# Patient Record
Sex: Female | Born: 1950 | Race: White | Hispanic: No | Marital: Married | Smoking: Former smoker
Health system: Southern US, Community
[De-identification: ages and names within clinical notes are randomized; demographics above are authoritative.]

## PROBLEM LIST (undated history)

## (undated) DIAGNOSIS — T883XXA Malignant hyperthermia due to anesthesia, initial encounter: Secondary | ICD-10-CM

## (undated) HISTORY — DX: Malignant hyperthermia due to anesthesia, initial encounter: T88.3XXA

## (undated) HISTORY — PX: OTHER SURGICAL HISTORY: SHX169

---

## 2021-02-01 ENCOUNTER — Emergency Department
Admission: EM | Admit: 2021-02-01 | Discharge: 2021-02-01 | Disposition: A | Discharge status: Home / Self Care | Payer: Medicare Other | Attending: Emergency Medicine | Admitting: Emergency Medicine

## 2021-02-01 ENCOUNTER — Encounter: Payer: Self-pay | Admitting: Emergency Medicine

## 2021-02-01 ENCOUNTER — Emergency Department: Payer: Medicare Other

## 2021-02-01 ENCOUNTER — Other Ambulatory Visit: Payer: Self-pay

## 2021-02-01 DIAGNOSIS — W01198A Fall on same level from slipping, tripping and stumbling with subsequent striking against other object, initial encounter: Secondary | ICD-10-CM | POA: Insufficient documentation

## 2021-02-01 DIAGNOSIS — S52531A Colles' fracture of right radius, initial encounter for closed fracture: Secondary | ICD-10-CM | POA: Diagnosis not present

## 2021-02-01 DIAGNOSIS — Z87891 Personal history of nicotine dependence: Secondary | ICD-10-CM | POA: Insufficient documentation

## 2021-02-01 DIAGNOSIS — Y92096 Garden or yard of other non-institutional residence as the place of occurrence of the external cause: Secondary | ICD-10-CM | POA: Diagnosis not present

## 2021-02-01 DIAGNOSIS — S52691A Other fracture of lower end of right ulna, initial encounter for closed fracture: Secondary | ICD-10-CM

## 2021-02-01 DIAGNOSIS — Y93H2 Activity, gardening and landscaping: Secondary | ICD-10-CM | POA: Diagnosis not present

## 2021-02-01 DIAGNOSIS — S52601A Unspecified fracture of lower end of right ulna, initial encounter for closed fracture: Secondary | ICD-10-CM | POA: Insufficient documentation

## 2021-02-01 DIAGNOSIS — S6991XA Unspecified injury of right wrist, hand and finger(s), initial encounter: Secondary | ICD-10-CM | POA: Diagnosis present

## 2021-02-01 MED ORDER — OXYCODONE HCL 5 MG PO TABS: 5.0000 mg | ORAL_TABLET | Freq: Three times a day (TID) | ORAL | 0 refills | Status: AC | PRN

## 2021-02-01 MED ORDER — MELOXICAM 7.5 MG PO TABS: 7.5000 mg | ORAL_TABLET | Freq: Every day | ORAL | 0 refills | Status: AC

## 2021-02-01 MED ORDER — OXYCODONE HCL 5 MG PO TABS
5.0000 mg | ORAL_TABLET | Freq: Once | ORAL | Status: AC
Start: 2021-02-01 — End: 2021-02-01
  Administered 2021-02-01: 5 mg via ORAL
  Filled 2021-02-01: qty 1

## 2021-02-01 NOTE — ED Provider Notes (Signed)
[facility] Emergency Department Provider Note ____________________________________________  Time seen: Approximately 5:26 PM  I have reviewed the triage vital signs and the nursing notes.   HISTORY  Chief Complaint Arm Pain    HPI Raven Jones is a 70 y.o. female who presents to the emergency department for evaluation and treatment of right wrist and arm pain after falling onto her outstretched arm.  She was out cleaning the garden shed when she fell forward and tried to protect her face from hitting the wall and injured her right wrist.  Pain radiates from the hand/wrist up to the shoulder.  No other injuries.  No alleviating measures attempted prior to arrival.   Past Medical History:  Diagnosis Date  . Malignant hyperthermia     There are no problems to display for this patient.   Past Surgical History:  Procedure Laterality Date  . Partial Hysterectomy      Prior to Admission medications   Medication Sig Start Date End Date Taking? Authorizing Provider  meloxicam (MOBIC) 7.5 MG tablet Take 1 tablet (7.5 mg total) by mouth daily. 02/01/21 02/01/22 Yes Juanette Urizar B, FNP  oxyCODONE (ROXICODONE) 5 MG immediate release tablet Take 1 tablet (5 mg total) by mouth every 8 (eight) hours as needed. 02/01/21 02/01/22 Yes Cythia Bachtel B, FNP    Allergies Amoxicillin and Other  No family history on file.  Social History Social History   Tobacco Use  . Smoking status: Former Games developer  . Smokeless tobacco: Never Used  Substance Use Topics  . Alcohol use: Not Currently  . Drug use: Not Currently    Review of Systems Constitutional: Negative for fever. Cardiovascular: Negative for chest pain. Respiratory: Negative for shortness of breath. Musculoskeletal: Positive for right wrist and right arm pain. Skin: Negative for open wounds or lesions. Neurological: Negative for decrease in  sensation  ____________________________________________   PHYSICAL EXAM:  VITAL SIGNS: ED Triage Vitals  Enc Vitals Group     BP 02/01/21 1456 (!) 96/44     Pulse Rate 02/01/21 1456 (!) 56     Resp 02/01/21 1456 20     Temp 02/01/21 1456 98.2 F (36.8 C)     Temp Source 02/01/21 1456 Oral     SpO2 02/01/21 1456 97 %     Weight 02/01/21 1453 175 lb (79.4 kg)     Height 02/01/21 1453 5\' 5"  (1.651 m)     Head Circumference --      Peak Flow --      Pain Score 02/01/21 1453 8     Pain Loc --      Pain Edu? --      Excl. in GC? --     Constitutional: Alert and oriented. Well appearing and in no acute distress. Eyes: Conjunctivae are clear without discharge or drainage Head: Atraumatic Neck: Supple. Respiratory: No cough. Respirations are even and unlabored. Musculoskeletal: Deformity of the right wrist.  Diffuse tenderness along the forearm and elbow.  Range of motion of the right upper extremity is limited by pain. Neurologic: Motor and sensory function is intact. Skin: No open wounds or lesions.  She does have some early ecchymosis noted to the palm and thenar eminence Psychiatric: Affect and behavior are appropriate.  ____________________________________________   LABS (all labs ordered are listed, but only abnormal results are displayed)  Labs Reviewed - No data to display ____________________________________________  RADIOLOGY  Comminuted fracture of the distal right radius with extension into the radiocarpal joint  space.  I, Kem Boroughs, personally viewed and evaluated these images (plain radiographs) as part of my medical decision making, as well as reviewing the written report by the radiologist.  DG Forearm Right  Result Date: 02/01/2021 CLINICAL DATA:  Status post fall. EXAM: RIGHT FOREARM - 2 VIEW COMPARISON:  None. FINDINGS: An acute comminuted fracture deformity is seen involving the distal right radius. Extension to involve the radiocarpal articulation  is seen. An additional nondisplaced fracture of the distal right ulna is noted. There is no evidence of dislocation. Moderate severity diffuse soft tissue swelling is present. IMPRESSION: Acute fractures of the distal right radius and distal right ulna. Electronically Signed   By: Aram Candela M.D.   On: 02/01/2021 15:58   DG Humerus Right  Result Date: 02/01/2021 CLINICAL DATA:  Status post fall. EXAM: RIGHT HUMERUS - 2+ VIEW COMPARISON:  None. FINDINGS: There is no evidence of fracture or other focal bone lesions. Soft tissues are unremarkable. IMPRESSION: Negative. Electronically Signed   By: Aram Candela M.D.   On: 02/01/2021 15:59   ____________________________________________   PROCEDURES  .Ortho Injury Treatment  Date/Time: 02/01/2021 5:47 PM Performed by: Chinita Pester, FNP Authorized by: Chinita Pester, FNP   Consent:    Consent obtained:  Verbal   Consent given by:  Patient   Risks discussed:  Restricted joint movementInjury location: wrist Location details: right wrist Injury type: fracture Fracture type: distal radius and ulnar styloid Pre-procedure neurovascular assessment: neurovascularly intact Pre-procedure distal perfusion: normal Pre-procedure neurological function: normal Pre-procedure range of motion: reduced Manipulation performed: no Immobilization: splint Splint type: sugar tong Splint Applied by: ED Tech Supplies used: cotton padding,  elastic bandage and Ortho-Glass Post-procedure neurovascular assessment: post-procedure neurovascularly intact Post-procedure distal perfusion: normal Post-procedure neurological function: normal Post-procedure range of motion: unchanged     ____________________________________________   INITIAL IMPRESSION / ASSESSMENT AND PLAN / ED COURSE  Raven Jones is a 70 y.o. who presents to the emergency department for treatment and evaluation of right wrist pain.  See HPI for further details.  Image of the  right wrist shows a comminuted fracture of the distal radius that extends into the articular surface.  Patient instructed to follow-up with orthopedics.  She was also instructed to return to the emergency department for symptoms that change or worsen if unable schedule an appointment with orthopedics or primary care.  Medications  oxyCODONE (Oxy IR/ROXICODONE) immediate release tablet 5 mg (5 mg Oral Given 02/01/21 1833)    Pertinent labs & imaging results that were available during my care of the patient were reviewed by me and considered in my medical decision making (see chart for details).   _________________________________________   FINAL CLINICAL IMPRESSION(S) / ED DIAGNOSES  Final diagnoses:  Closed Colles' fracture of right radius, initial encounter  Other closed fracture of distal end of right ulna, initial encounter    ED Discharge Orders         Ordered    oxyCODONE (ROXICODONE) 5 MG immediate release tablet  Every 8 hours PRN        02/01/21 1934    meloxicam (MOBIC) 7.5 MG tablet  Daily        02/01/21 1934           If controlled substance prescribed during this visit, 12 month history viewed on the [facility] prior to issuing an initial prescription for Schedule II or III opiod.   Chinita Pester, FNP 02/01/21 Nathanial Rancher,  MD 02/02/21 1621

## 2021-02-01 NOTE — ED Triage Notes (Signed)
Pt states mechanical fall earlier today, states tripped and put her arm out to catch herself. Pt c/o pain from R wrist up to shoulder, pt with noted swelling to R wrist at this time.

## 2021-02-01 NOTE — ED Notes (Addendum)
Pt asking this writer to wait on splint application until after meds are given and she is able to take her ring off finger. Lillia Abed, RN notified.

## 2021-02-01 NOTE — Discharge Instructions (Signed)
Please call and schedule an appointment with orthopedics.  Return to the ER for symptoms of concern.

## 2022-04-28 IMAGING — CR DG FOREARM 2V*R*
1 series · 3 of 3 positions shown · non-contrast
Comparison: None.

CLINICAL DATA: Status post fall.

EXAM:
RIGHT FOREARM - 2 VIEW

[Series 1: dg forearm right · 0.14mm/px · 3 of 3 slices shown]
[im 1/3]
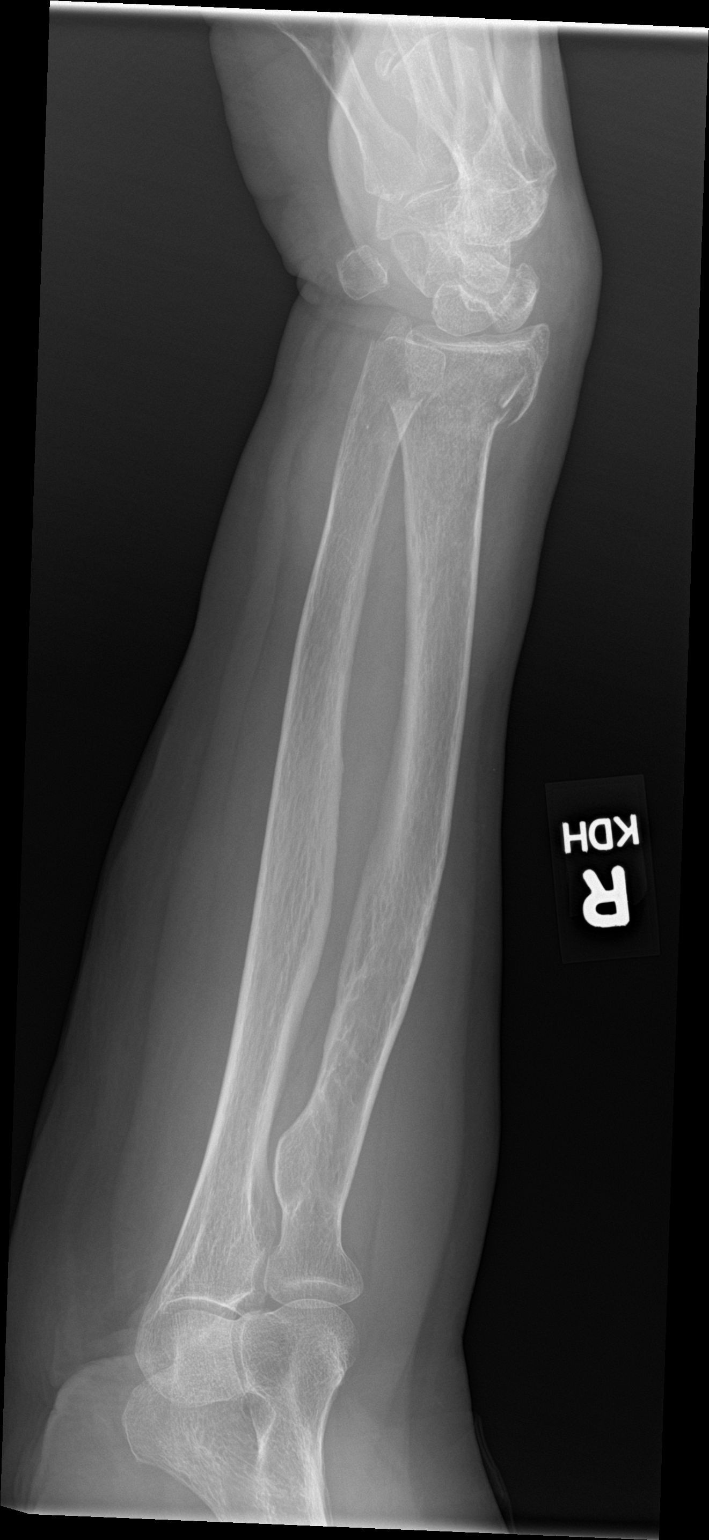
[im 2/3]
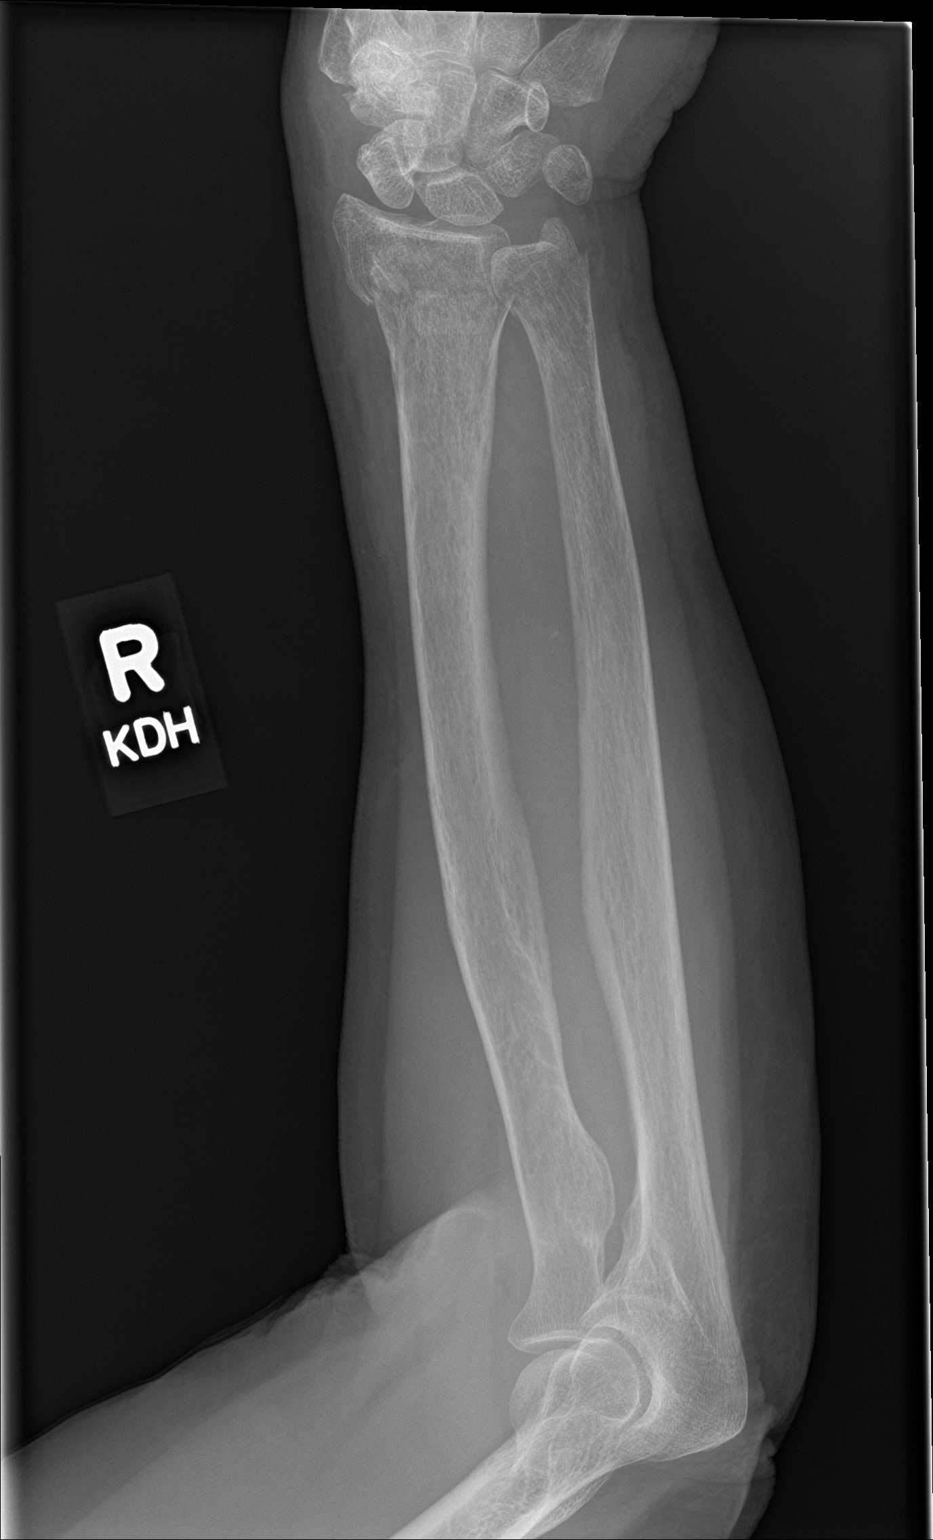
[im 3/3]
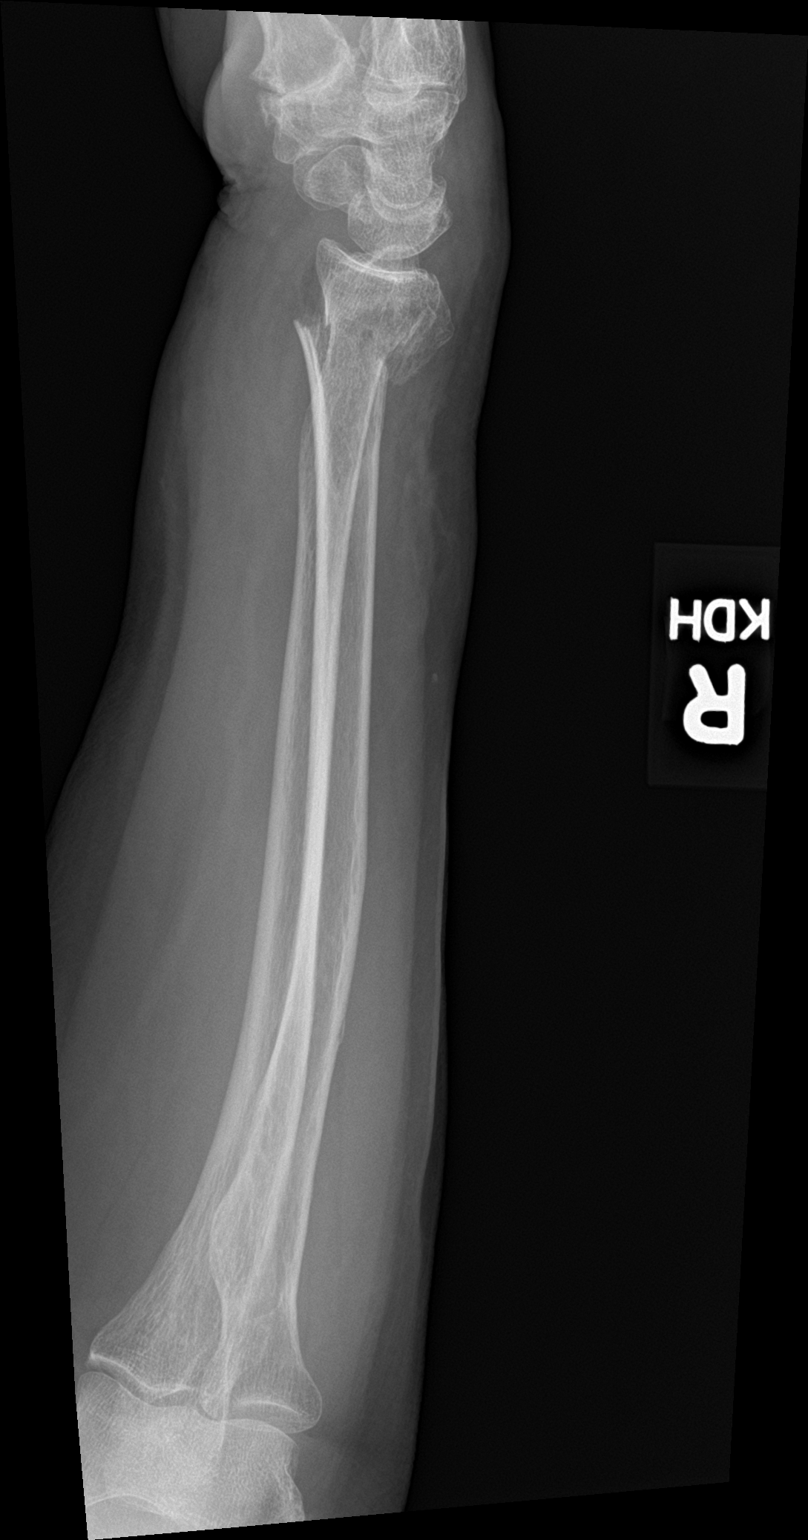

[3 of 3 positions shown; findings below may reference images not displayed]

FINDINGS: An acute comminuted fracture deformity is seen involving the distal
right radius. Extension to involve the radiocarpal articulation is
seen. An additional nondisplaced fracture of the distal right ulna
is noted. There is no evidence of dislocation. Moderate severity
diffuse soft tissue swelling is present.
IMPRESSION: Acute fractures of the distal right radius and distal right ulna.
